# Patient Record
Sex: Male | Born: 2005 | Race: White | Hispanic: No | Marital: Single | Smoking: Never smoker
Health system: Southern US, Community
[De-identification: ages and names within clinical notes are randomized; demographics above are authoritative.]

## PROBLEM LIST (undated history)

## (undated) DIAGNOSIS — J45909 Unspecified asthma, uncomplicated: Secondary | ICD-10-CM

---
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
## 2023-03-27 ENCOUNTER — Emergency Department: Payer: Self-pay

## 2023-03-27 ENCOUNTER — Emergency Department
Admission: EM | Admit: 2023-03-27 | Discharge: 2023-03-27 | Disposition: A | Discharge status: Home / Self Care | Payer: Worker's Compensation | Attending: Emergency Medicine | Admitting: Emergency Medicine

## 2023-03-27 ENCOUNTER — Other Ambulatory Visit: Payer: Self-pay

## 2023-03-27 DIAGNOSIS — Z23 Encounter for immunization: Secondary | ICD-10-CM | POA: Insufficient documentation

## 2023-03-27 DIAGNOSIS — W311XXA Contact with metalworking machines, initial encounter: Secondary | ICD-10-CM | POA: Diagnosis not present

## 2023-03-27 DIAGNOSIS — S81811A Laceration without foreign body, right lower leg, initial encounter: Secondary | ICD-10-CM | POA: Insufficient documentation

## 2023-03-27 DIAGNOSIS — Y990 Civilian activity done for income or pay: Secondary | ICD-10-CM | POA: Diagnosis not present

## 2023-03-27 MED ORDER — BACITRACIN ZINC 500 UNIT/GM EX OINT
TOPICAL_OINTMENT | Freq: Once | CUTANEOUS | Status: AC
  Administered 2023-03-27: 1 via TOPICAL
  Filled 2023-03-27: qty 0.9

## 2023-03-27 MED ORDER — DOXYCYCLINE HYCLATE 100 MG PO CAPS
100.0000 mg | ORAL_CAPSULE | Freq: Two times a day (BID) | ORAL | 0 refills | Status: AC
  Filled 2023-03-27: qty 20, 10d supply, fill #0

## 2023-03-27 MED ORDER — LIDOCAINE-EPINEPHRINE 2 %-1:100000 IJ SOLN
20.0000 mL | Freq: Once | INTRAMUSCULAR | Status: AC
  Administered 2023-03-27: 5 mL via INTRADERMAL
  Filled 2023-03-27: qty 1

## 2023-03-27 MED ORDER — TETANUS-DIPHTH-ACELL PERTUSSIS 5-2.5-18.5 LF-MCG/0.5 IM SUSY
0.5000 mL | PREFILLED_SYRINGE | Freq: Once | INTRAMUSCULAR | Status: AC
  Administered 2023-03-27: 0.5 mL via INTRAMUSCULAR
  Filled 2023-03-27: qty 0.5

## 2023-03-27 NOTE — ED Notes (Signed)
Patient provided snack per request.

## 2023-03-27 NOTE — ED Triage Notes (Signed)
 Laceration to right upper leg, cut with a grinding disc.  DSD placed. Bleeding controlled. NAD

## 2023-03-27 NOTE — Discharge Instructions (Signed)
Do not get the sutured area wet for 24 hours. After 24 hours, shower/bathe as usual and pat the area dry. °Change the bandage 2 times per day and apply antibiotic ointment. °Leave open to air when at no risk of getting the area dirty, but cover at night before bed. °See your PCP or go to Urgent Care in 10 days for suture removal or sooner for signs or concern of infection. ° °

## 2023-03-27 NOTE — ED Provider Notes (Signed)
 [facility] Provider Note    Event Date/Time   First MD Initiated Contact with Patient 03/27/23 1046     (approximate)   History   Laceration   HPI  Benjamin Mccoy is a 18 y.o. male with no significant past medical history and as listed in EMR presents to the emergency department for treatment and evaluation of laceration to right upper leg after accidentally cutting it with a grinding disc while at work. He has a laceration to the right upper leg. Bleeding controlled. Unsure of last tdap.     Physical Exam   Triage Vital Signs: ED Triage Vitals [03/27/23 1024]  Encounter Vitals Group     BP      Systolic BP Percentile      Diastolic BP Percentile      Pulse      Resp      Temp      Temp src      SpO2      Weight 240 lb (108.9 kg)     Height 5\' 9"  (1.753 m)     Head Circumference      Peak Flow      Pain Score 5     Pain Loc      Pain Education      Exclude from Growth Chart     Most recent vital signs: Vitals:   03/27/23 1302  BP: (!) 145/67  Pulse: 82  Resp: 15  Temp: 99 F (37.2 C)  SpO2: 98%    General: Awake, no distress.  CV:  Good peripheral perfusion.  Resp:  Normal effort.  Abd:  No distention.  Other:  3.5cm laceration to the right mid upper leg   ED Results / Procedures / Treatments   Labs (all labs ordered are listed, but only abnormal results are displayed) Labs Reviewed - No data to display   EKG  Not indicated.   RADIOLOGY  Image and radiology report reviewed and interpreted by me. Radiology report consistent with the same.  No retained foreign body right mid anterior thigh  PROCEDURES:  Critical Care performed: No  .Laceration Repair  Date/Time: 03/27/2023 1:31 PM  Performed by: Chinita Pester, FNP Authorized by: Chinita Pester, FNP   Consent:    Consent obtained:  Verbal   Consent given by:  Patient   Risks discussed:  Infection, retained foreign body and poor wound  healing Universal protocol:    Patient identity confirmed:  Verbally with patient Anesthesia:    Anesthesia method:  Local infiltration   Local anesthetic:  Lidocaine 2% WITH epi Laceration details:    Location:  Leg   Leg location:  R upper leg   Length (cm):  3.5 Pre-procedure details:    Preparation:  Patient was prepped and draped in usual sterile fashion Exploration:    Hemostasis achieved with:  Epinephrine   Imaging obtained: x-ray     Imaging outcome: foreign body not noted     Wound exploration: entire depth of wound visualized     Wound extent: foreign bodies/material     Contaminated: yes   Treatment:    Area cleansed with:  Povidone-iodine and saline   Amount of cleaning:  Standard   Irrigation solution:  Sterile saline   Irrigation volume:  250   Irrigation method:  Syringe   Visualized foreign bodies/material removed: yes     Debridement:  Minimal   Layers/structures repaired:  Deep subcutaneous Skin repair:  Repair method:  Sutures   Suture size:  4-0   Suture material:  Nylon   Suture technique:  Vertical mattress and simple interrupted   Number of sutures:  5 Approximation:    Approximation:  Close Repair type:    Repair type:  Complex Post-procedure details:    Dressing:  Antibiotic ointment and sterile dressing   Procedure completion:  Tolerated well, no immediate complications    MEDICATIONS ORDERED IN ED:  Medications  lidocaine-EPINEPHrine (XYLOCAINE W/EPI) 2 %-1:100000 (with pres) injection 20 mL (5 mLs Intradermal Given by Other 03/27/23 1230)  Tdap (BOOSTRIX) injection 0.5 mL (0.5 mLs Intramuscular Given 03/27/23 1142)  bacitracin ointment (1 Application Topical Given 03/27/23 1258)     IMPRESSION / MDM / ASSESSMENT AND PLAN / ED COURSE   I have reviewed the triage note.  Differential diagnosis includes, but is not limited to, skin laceration, retained foreign body  Patient's presentation is most consistent with acute illness / injury  with system symptoms.  18 year old male presents to the ER for evaluation after laceration to the anterior thigh from grinding wheel. See HPI for further details.  Wound cleaned and repaired as described above.  Home care instructions discussed.  Patient will go to primary care or urgent care in about 10 days for suture removal.  Signs and symptoms of infection were reviewed.  He will be evaluated sooner for any concerns.      FINAL CLINICAL IMPRESSION(S) / ED DIAGNOSES   Final diagnoses:  Laceration of right lower extremity, initial encounter     Rx / DC Orders   ED Discharge Orders          Ordered    doxycycline (VIBRAMYCIN) 100 MG capsule  2 times daily        03/27/23 1247             Note:  This document was prepared using Dragon voice recognition software and may include unintentional dictation errors.   Chinita Pester, FNP 03/27/23 1334    Jene Every, MD 03/27/23 1447
